# Patient Record
Sex: Male | Born: 1995 | Race: White | Hispanic: No | Marital: Single | Smoking: Never smoker
Health system: Southern US, Community
[De-identification: ages and names within clinical notes are randomized; demographics above are authoritative.]

## PROBLEM LIST (undated history)

## (undated) DIAGNOSIS — R569 Unspecified convulsions: Secondary | ICD-10-CM

---

## 2019-01-19 ENCOUNTER — Other Ambulatory Visit: Payer: Self-pay

## 2019-01-19 ENCOUNTER — Emergency Department: Payer: Commercial Managed Care - PPO

## 2019-01-19 ENCOUNTER — Emergency Department
Admission: EM | Admit: 2019-01-19 | Discharge: 2019-01-19 | Disposition: A | Discharge status: Home / Self Care | Payer: Commercial Managed Care - PPO | Attending: Emergency Medicine | Admitting: Emergency Medicine

## 2019-01-19 ENCOUNTER — Encounter: Payer: Self-pay | Admitting: Emergency Medicine

## 2019-01-19 DIAGNOSIS — W268XXA Contact with other sharp object(s), not elsewhere classified, initial encounter: Secondary | ICD-10-CM | POA: Diagnosis not present

## 2019-01-19 DIAGNOSIS — S61411A Laceration without foreign body of right hand, initial encounter: Secondary | ICD-10-CM | POA: Diagnosis not present

## 2019-01-19 DIAGNOSIS — Y9389 Activity, other specified: Secondary | ICD-10-CM | POA: Diagnosis not present

## 2019-01-19 DIAGNOSIS — Y929 Unspecified place or not applicable: Secondary | ICD-10-CM | POA: Diagnosis not present

## 2019-01-19 DIAGNOSIS — Y998 Other external cause status: Secondary | ICD-10-CM | POA: Diagnosis not present

## 2019-01-19 DIAGNOSIS — S6991XA Unspecified injury of right wrist, hand and finger(s), initial encounter: Secondary | ICD-10-CM | POA: Diagnosis present

## 2019-01-19 HISTORY — DX: Unspecified convulsions: R56.9

## 2019-01-19 MED ORDER — LIDOCAINE HCL (PF) 1 % IJ SOLN
5.0000 mL | Freq: Once | INTRAMUSCULAR | Status: AC
  Administered 2019-01-19: 5 mL via INTRADERMAL
  Filled 2019-01-19: qty 5

## 2019-01-19 MED ORDER — CEPHALEXIN 500 MG PO CAPS: 500.0000 mg | ORAL_CAPSULE | Freq: Three times a day (TID) | ORAL | 0 refills | Status: DC

## 2019-01-19 NOTE — Discharge Instructions (Addendum)
Follow up with your regular doctor, the ER, or you may remove the sutures yourself.  REturn if any sign of infection

## 2019-01-19 NOTE — ED Provider Notes (Signed)
[facility] Emergency Department Provider Note  ____________________________________________   First MD Initiated Contact with Patient 01/19/19 1324     (approximate)  I have reviewed the triage vital signs and the nursing notes.   HISTORY  Chief Complaint Laceration    HPI Kyle Madden is a 23 y.o. male presents emergency department complaining of a laceration to his right hand.  He was welding his truck and a piece of metal came down to hit his hand.  Tdap is up-to-date.    Past Medical History:  Diagnosis Date  . Seizures (HCC)     There are no active problems to display for this patient.   History reviewed. No pertinent surgical history.  Prior to Admission medications   Medication Sig Start Date End Date Taking? Authorizing Provider  cephALEXin (KEFLEX) 500 MG capsule Take 1 capsule (500 mg total) by mouth 3 (three) times daily. 01/19/19   Faythe GheeFisher, Susan W, PA-C    Allergies Ritalin [methylphenidate hcl]  No family history on file.  Social History Social History   Tobacco Use  . Smoking status: Never Smoker  . Smokeless tobacco: Never Used  Substance Use Topics  . Alcohol use: Yes    Comment: occasional  . Drug use: Not on file    Review of Systems  Constitutional: No fever/chills Eyes: No visual changes. ENT: No sore throat. Respiratory: Denies cough Genitourinary: Negative for dysuria. Musculoskeletal: Negative for back pain.  Right hand pain with laceration Skin: Negative for rash.    ____________________________________________   PHYSICAL EXAM:  VITAL SIGNS: ED Triage Vitals  Enc Vitals Group     BP 01/19/19 1324 120/78     Pulse Rate 01/19/19 1324 88     Resp 01/19/19 1324 18     Temp 01/19/19 1324 98 F (36.7 C)     Temp Source 01/19/19 1324 Oral     SpO2 01/19/19 1324 99 %     Weight 01/19/19 1320 135 lb (61.2 kg)     Height 01/19/19 1320 5\' 3"  (1.6 m)     Head Circumference --      Peak Flow --     Pain Score 01/19/19 1320 4     Pain Loc --      Pain Edu? --      Excl. in GC? --     Constitutional: Alert and oriented. Well appearing and in no acute distress. Eyes: Conjunctivae are normal.  Head: Atraumatic. Nose: No congestion/rhinnorhea. Mouth/Throat: Mucous membranes are moist.   Neck:  supple no lymphadenopathy noted Cardiovascular: Normal rate, regular rhythm. Respiratory: Normal respiratory effort.  No retractions,  GU: deferred Musculoskeletal: FROM all extremities, warm and well perfused, 2 cm laceration noted to the right hand, no foreign bodies noted, area is tender to palpation Neurologic:  Normal speech and language.  Skin:  Skin is warm, dry , positive laceration.. No rash noted. Psychiatric: Mood and affect are normal. Speech and behavior are normal.  ____________________________________________   LABS (all labs ordered are listed, but only abnormal results are displayed)  Labs Reviewed - No data to display ____________________________________________   ____________________________________________  RADIOLOGY  X-ray of the right hand negative for foreign body or fracture  ____________________________________________   PROCEDURES  Procedure(s) performed:   Marland Kitchen.Marland Kitchen.Laceration Repair Date/Time: 01/19/2019 4:01 PM Performed by: Faythe GheeFisher, Susan W, PA-C Authorized by: Faythe GheeFisher, Susan W, PA-C   Consent:    Consent obtained:  Verbal   Consent given by:  Patient   Risks discussed:  Infection, pain, retained foreign body, tendon damage, poor wound healing and nerve damage Anesthesia (see MAR for exact dosages):    Anesthesia method:  Local infiltration   Local anesthetic:  Lidocaine 1% w/o epi Laceration details:    Location:  Hand   Hand location:  R palm   Length (cm):  2   Depth (mm):  2 Repair type:    Repair type:  Simple Pre-procedure details:    Preparation:  Patient was prepped and draped in usual sterile fashion Exploration:    Hemostasis achieved  with:  Direct pressure   Wound exploration: wound explored through full range of motion     Wound extent: foreign bodies/material     Wound extent: no muscle damage noted, no nerve damage noted, no tendon damage noted and no underlying fracture noted     Foreign bodies/material:  Dirt fragments Treatment:    Area cleansed with:  Saline   Amount of cleaning:  Standard   Irrigation solution:  Sterile saline   Irrigation method:  Syringe and tap   Visualized foreign bodies/material removed: yes   Skin repair:    Repair method:  Sutures   Suture size:  5-0   Suture material:  Nylon   Suture technique:  Simple interrupted   Number of sutures:  7 Approximation:    Approximation:  Close Post-procedure details:    Dressing:  Non-adherent dressing   Patient tolerance of procedure:  Tolerated well, no immediate complications      ____________________________________________   INITIAL IMPRESSION / ASSESSMENT AND PLAN / ED COURSE  Pertinent labs & imaging results that were available during my care of the patient were reviewed by me and considered in my medical decision making (see chart for details).   Patient is 23 year old male presents emergency department with laceration to the right hand.  Physical exam shows a laceration on the palmar side of the right hand.  No foreign body or tendon involvement is noted.  X-ray of the right hand is negative for foreign body or fracture.  See procedure note for repair.  During the cleansing of the wound dirt was removed.  Tolerated procedure well.  Was placed on Keflex due to the contamination with the dirt.  He is to follow-up with his regular doctor if not better in 1 week.  Sutures to be removed in 1 week.  Return emergency department worsening.  Is discharged stable condition.     As part of my medical decision making, I reviewed the following data within the [facility] notes reviewed and incorporated, Old chart  reviewed, Radiograph reviewed x-ray of the right hand is negative, Notes from prior ED visits and [place] Controlled Substance Database  ____________________________________________   FINAL CLINICAL IMPRESSION(S) / ED DIAGNOSES  Final diagnoses:  Laceration of right hand without foreign body, initial encounter      NEW MEDICATIONS STARTED DURING THIS VISIT:  Discharge Medication List as of 01/19/2019  2:34 PM    START taking these medications   Details  cephALEXin (KEFLEX) 500 MG capsule Take 1 capsule (500 mg total) by mouth 3 (three) times daily., Starting Mon 01/19/2019, Normal         Note:  This document was prepared using Dragon voice recognition software and may include unintentional dictation errors.    Versie Starks, PA-C 01/19/19 1604    Duffy Bruce, MD 01/19/19 2256

## 2019-01-19 NOTE — ED Triage Notes (Signed)
See triage note  States he was welding on his truck  A piece of metal came down and hit his right hand

## 2019-02-26 ENCOUNTER — Other Ambulatory Visit: Payer: Self-pay

## 2019-02-26 DIAGNOSIS — Z20822 Contact with and (suspected) exposure to covid-19: Secondary | ICD-10-CM

## 2019-03-02 LAB — NOVEL CORONAVIRUS, NAA: SARS-CoV-2, NAA: NOT DETECTED

## 2020-06-11 IMAGING — DX RIGHT HAND - COMPLETE 3+ VIEW
3 series · 3 of 3 positions shown · non-contrast
Comparison: None.

CLINICAL DATA: Right hand laceration.  Welding injury.

EXAM:
RIGHT HAND - COMPLETE 3+ VIEW

[hand ap]
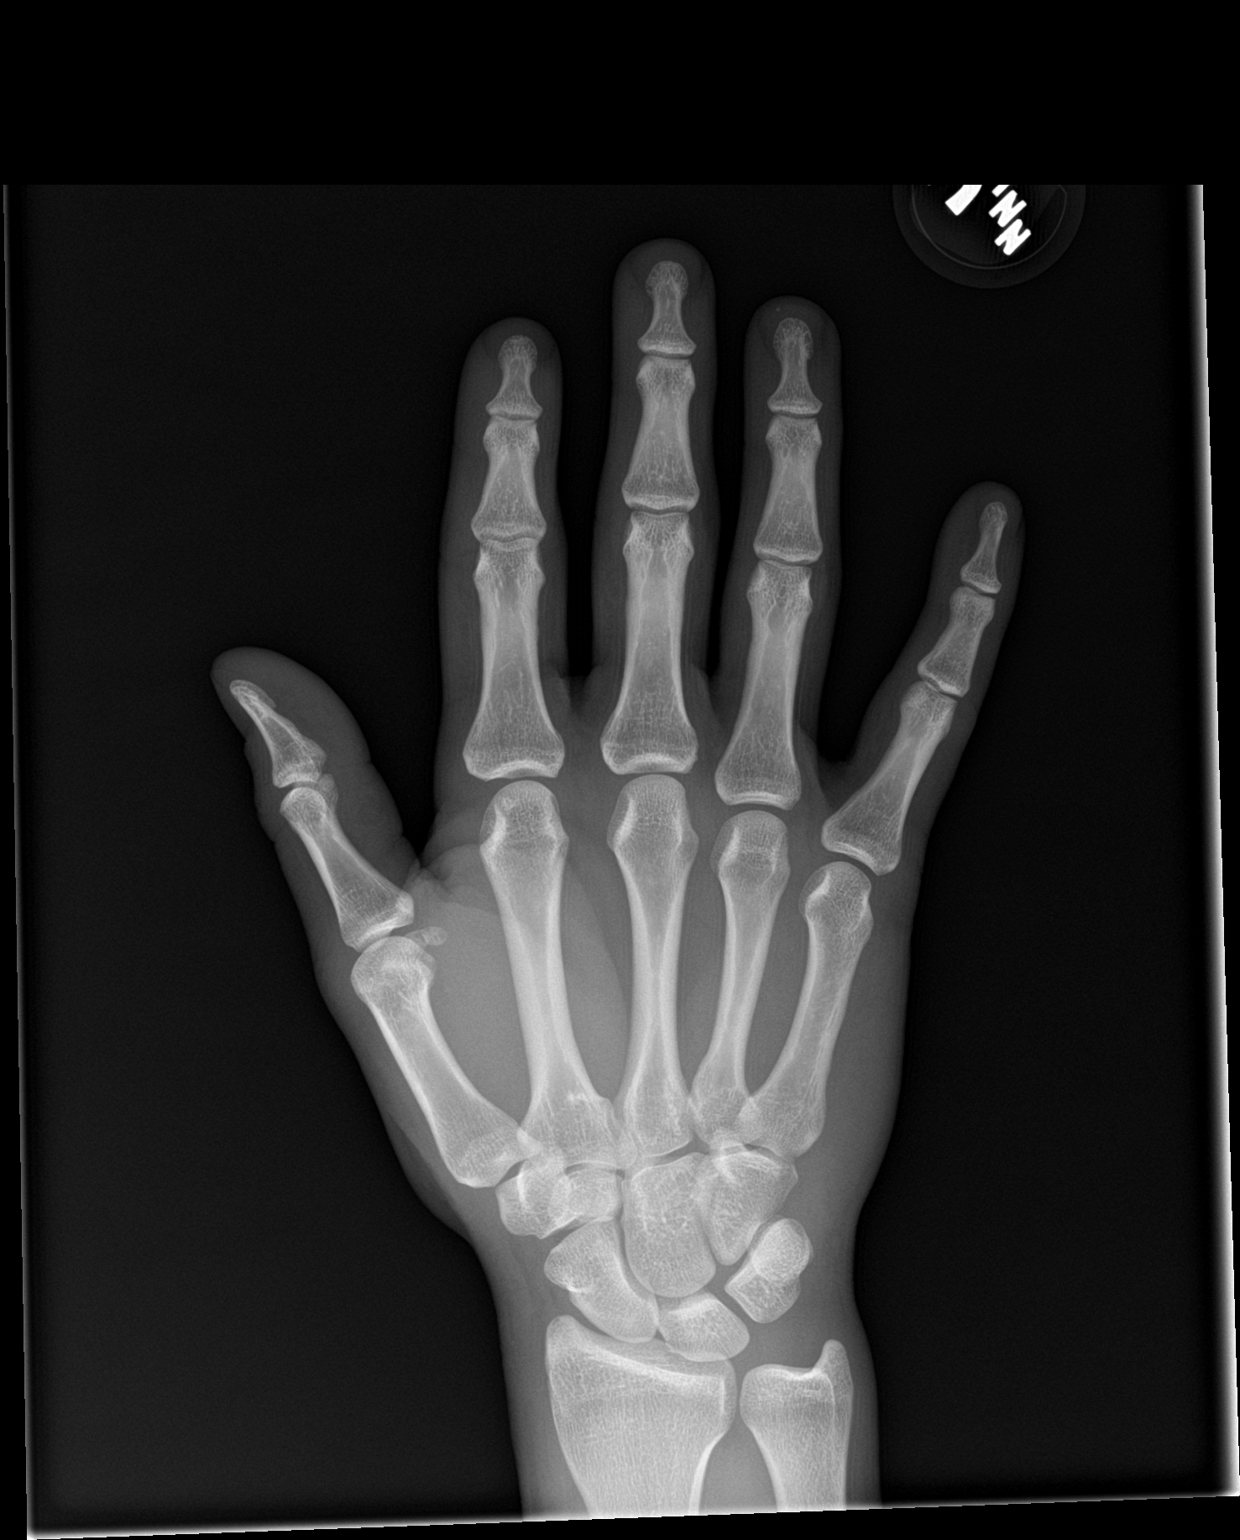

[hand obl]
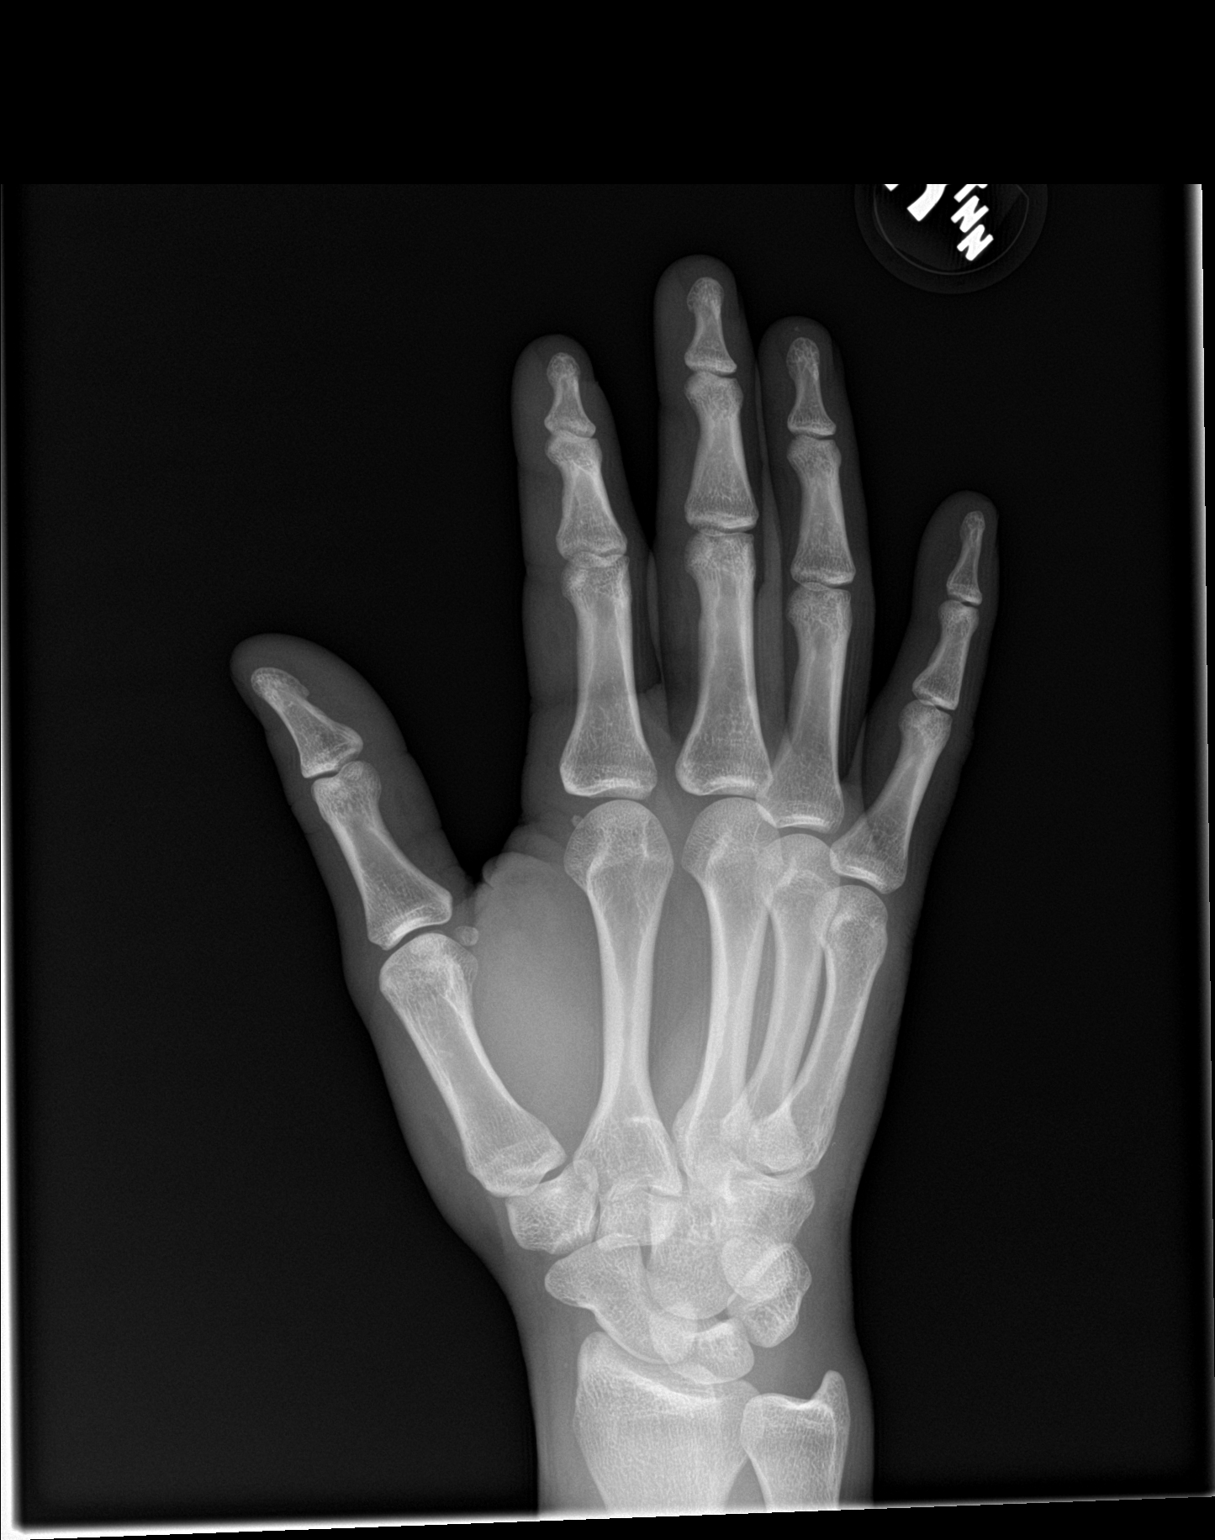

[hand lat]
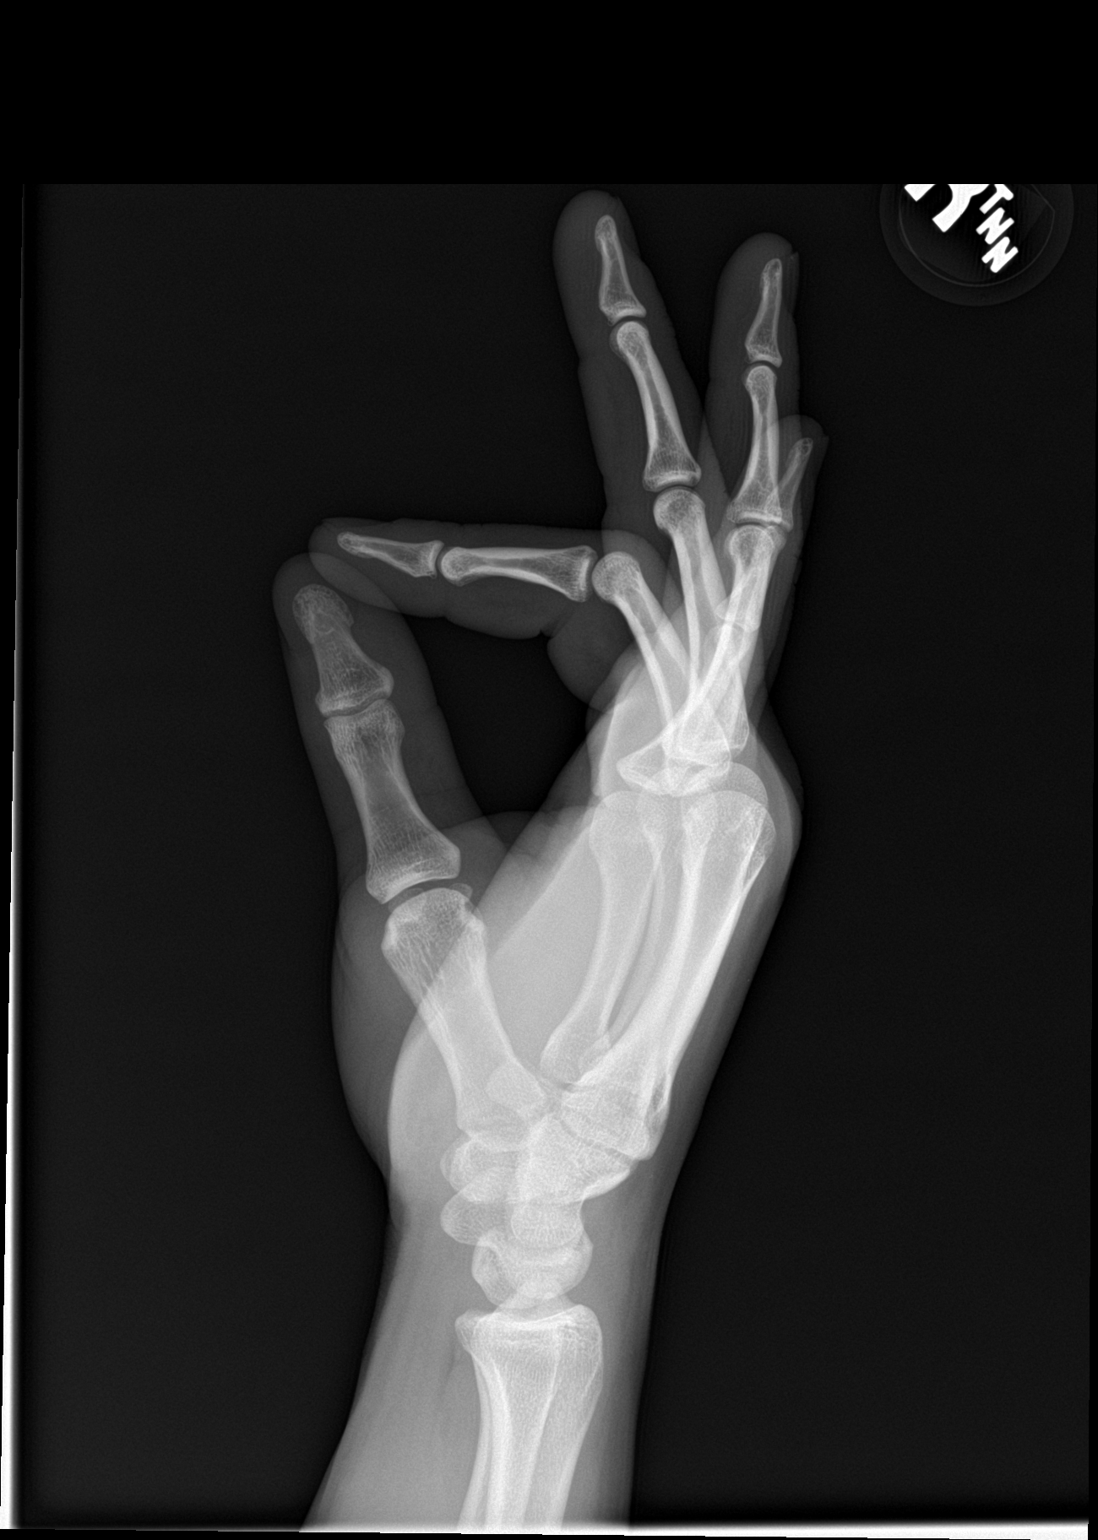

[3 of 3 positions shown; findings below may reference images not displayed]

FINDINGS: The joint spaces are maintained. No acute fracture is identified. No
radiopaque foreign body.
IMPRESSION: No fracture or radiopaque foreign body.

## 2021-08-22 NOTE — Progress Notes (Incomplete)
08/23/21 3:53 PM   Kyle Madden 07-Aug-1996 [id number]  Referring provider: No referring provider defined for this encounter.  No chief complaint on file.   HPI: Kyle Madden is a 26 y.o. male who presents today for further evaluation of possible vasectomy.  He denies a history of testicular trauma or pain.  No urinary issues.  No previous scrotal surgeries.   PMH: Past Medical History:  Diagnosis Date   Seizures [facility])     Surgical History: No past surgical history on file.  Home Medications:  Allergies as of 08/23/2021       Reactions   Ritalin [methylphenidate Hcl]    Rash  other        Medication List        Accurate as of August 23, 2021  3:53 PM. If you have any questions, ask your nurse or doctor.          cephALEXin 500 MG capsule Commonly known as: KEFLEX Take 1 capsule (500 mg total) by mouth 3 (three) times daily.        Allergies:  Allergies  Allergen Reactions   Ritalin [Methylphenidate Hcl]     Rash  other    Family History: No family history on file.  Social History:  reports that he has never smoked. He has never used smokeless tobacco. He reports current alcohol use. No history on file for drug use.   Physical Exam: There were no vitals taken for this visit.  Constitutional:  Alert and oriented, No acute distress. HEENT: [place] AT, moist mucus membranes.  Trachea midline, no masses. Cardiovascular: No clubbing, cyanosis, or edema. Respiratory: Normal respiratory effort, no increased work of breathing. GI: Abdomen is soft, nontender, nondistended, no abdominal masses GU: Normal phallus.  Bilateral descended testicles without masses.  Vasa easily palpable bilaterally. Skin: No rashes, bruises or suspicious lesions. Neurologic: Grossly intact, no focal deficits, moving all 4 extremities. Psychiatric: Normal mood and affect.   Assessment & Plan:    1. Vasectomy evaluation Today, we discussed what the vas deferens is, where it  is located, and its function. We reviewed the procedure for vasectomy, it's risks, benefits, alternatives, and likelihood of achieving his goals. We discussed in detail the procedure, complications, and recovery as well as the need for clearance prior to unprotected intercourse. We discussed that vasectomy does not protect against sexually transmitted diseases. We discussed that this procedure does not result in immediate sterility and that they would need to use other forms of birth control until he has been cleared with negative postvasectomy semen analyses. I explained that the procedure is considered to be permanent and that attempts at reversal have varying degrees of success. These options include vasectomy reversal, sperm retrieval, and in vitro fertilization; these can be very expensive. We discussed the chance of postvasectomy pain syndrome which occurs in less than 5% of patients. I explained to the patient that there is no treatment to resolve this chronic pain, and that if it developed I would not be able to help resolve the issue, but that surgery is generally not needed for correction. I explained there have even been reports of systemic like illness associated with this chronic pain, and that there was no good cure. I explained that vasectomy it is not a 100% reliable form of birth control, and the risk of pregnancy after vasectomy is approximately 1 in 2000 men who had a negative postvasectomy semen analysis or rare non-motile sperm. I explained that repeat vasectomy was necessary  in less than 1% of vasectomy procedures when employing the type of technique that I use. I explained that he should refrain from ejaculation for approximately one week following vasectomy. I explained that there are other options for birth control which are permanent and non-permanent; we discussed these. I explained the rates of surgical complications, such as symptomatic hematoma or infection, are low (1-2%) and vary with  the surgeon's experience and criteria used to diagnose the complication.   The patient had the opportunity to ask questions to his stated satisfaction. He voiced understanding of the above factors and stated that he has read all the information provided to him and the packets and informed consent.  He is interested in receiving of Valium 10 mg prior to the procedure for the purpose of anxiolysis.  A prescription was given today.  He will have a driver on the day of the procedure.   No follow-ups on file.  I,Kailey Littlejohn,acting as a Education administrator for Hollice Espy, MD.,have documented all relevant documentation on the behalf of Hollice Espy, MD,as directed by  Hollice Espy, MD while in the presence of Hollice Espy, [place] [address] [place], [place] [address] [phone]

## 2021-08-23 ENCOUNTER — Other Ambulatory Visit: Payer: Self-pay

## 2021-08-23 ENCOUNTER — Ambulatory Visit: Payer: Self-pay | Admitting: Urology

## 2022-10-26 ENCOUNTER — Ambulatory Visit (INDEPENDENT_AMBULATORY_CARE_PROVIDER_SITE_OTHER): Payer: BC Managed Care – PPO | Admitting: Urology

## 2022-10-26 ENCOUNTER — Encounter: Payer: Self-pay | Admitting: Urology

## 2022-10-26 VITALS — BP 149/93 | HR 87 | Ht 66.0 in | Wt 150.0 lb

## 2022-10-26 DIAGNOSIS — Z3009 Encounter for other general counseling and advice on contraception: Secondary | ICD-10-CM

## 2022-10-26 NOTE — Progress Notes (Signed)
Assessment: 1. Encounter for vasectomy assessment     Plan: Schedule for vasectomy per patient request He declined the prescription for alprazolam.  Chief Complaint:  Chief Complaint  Patient presents with   VAS Consult    History of Present Illness:  Kyle Madden is a 27 y.o. male who is seen for evaluation of vasectomy.  He is married with 2 children.  No history of scrotal trauma or infection.   Past Medical History:  Past Medical History:  Diagnosis Date   Seizures ([place])     Past Surgical History:  No past surgical history on file.  Allergies:  Allergies  Allergen Reactions   Ritalin [Methylphenidate Hcl]     Rash  other    Family History:  No family history on file.  Social History:  Social History   Tobacco Use   Smoking status: Never   Smokeless tobacco: Never  Substance Use Topics   Alcohol use: Yes    Comment: occasional    Review of symptoms:  Constitutional:  Negative for unexplained weight loss, night sweats, fever, chills ENT:  Negative for nose bleeds, sinus pain, painful swallowing CV:  Negative for chest pain, shortness of breath, exercise intolerance, palpitations, loss of consciousness Resp:  Negative for cough, wheezing, shortness of breath GI:  Negative for nausea, vomiting, diarrhea, bloody stools GU:  Positives noted in HPI; otherwise negative for gross hematuria, dysuria, urinary incontinence Neuro:  Negative for seizures, poor balance, limb weakness, slurred speech Psych:  Negative for lack of energy, depression, anxiety Endocrine:  Negative for polydipsia, polyuria, symptoms of hypoglycemia (dizziness, hunger, sweating) Hematologic:  Negative for anemia, purpura, petechia, prolonged or excessive bleeding, use of anticoagulants  Allergic:  Negative for difficulty breathing or choking as a result of exposure to anything; no shellfish allergy; no allergic response (rash/itch) to materials, foods  Physical exam: BP (!) 149/93    Pulse 87   Ht 5\' 6"  (1.676 m)   Wt 150 lb (68 kg)   BMI 24.21 kg/m  GENERAL APPEARANCE:  Well appearing, well developed, well nourished, NAD HEENT:  Atraumatic, normocephalic, oropharynx clear NECK:  Supple without lymphadenopathy or thyromegaly ABDOMEN:  Soft, non-tender, no masses EXTREMITIES:  Moves all extremities well, without clubbing, cyanosis, or edema NEUROLOGIC:  Alert and oriented x 3, normal gait, CN II-XII grossly intact MENTAL STATUS:  appropriate BACK:  Non-tender to palpation, No CVAT SKIN:  Warm, dry, and intact GU: Penis:  uncircumcised Meatus: Normal Scrotum: Vasa palpated bilaterally Testis: normal without masses bilateral Epididymis: normal  Results: None  VASECTOMY CONSULTATION  [name] presents for vasectomy consultation today.  He is a 27 y.o. male, married with 2  children .  He and his wife have discussed the issues regarding long-term fertility and are comfortable with this decision.  He presents for consideration for vasectomy.  I discussed the issues in detail with him today and he expressed no reservations.  As to the procedure, no scalpel technique vasectomy is explained and reviewed in detail.  Generalized risks including but not limited to bleeding, infection, orchalgia, testicular atrophy, epididymitis, scrotal hematoma, and chronic pain are discussed.   Additionally, he understands that the possibility of vas recanalization following vasectomy is possible although rare.  Most importantly, the patient understands that he is not sterile initially and will need a semen analysis check to confirm sterility such that no sperm are seen.  He is advised to avoid ejaculation for 10 days following the procedure.  The initial semen analysis  will be checked in approximately 12 weeks and in some patients, several months may be required for clearance of all sperm.  He reports a clear understanding of the need for continued birth control until sterility is  confirmed.  Otherwise, general issues regarding local anesthesia, prep, alprazolam are discussed and he reports a clear understanding.

## 2022-10-26 NOTE — Patient Instructions (Signed)

## 2022-11-14 ENCOUNTER — Ambulatory Visit (INDEPENDENT_AMBULATORY_CARE_PROVIDER_SITE_OTHER): Payer: BC Managed Care – PPO | Admitting: Urology

## 2022-11-14 ENCOUNTER — Encounter: Payer: Self-pay | Admitting: Urology

## 2022-11-14 VITALS — BP 163/116 | HR 137 | Ht 66.0 in | Wt 148.0 lb

## 2022-11-14 DIAGNOSIS — Z302 Encounter for sterilization: Secondary | ICD-10-CM

## 2022-11-14 MED ORDER — CEPHALEXIN 500 MG PO CAPS: 500.0000 mg | ORAL_CAPSULE | Freq: Three times a day (TID) | ORAL | 0 refills | Status: AC

## 2022-11-14 MED ORDER — HYDROCODONE-ACETAMINOPHEN 5-325 MG PO TABS: 1.0000 | ORAL_TABLET | Freq: Four times a day (QID) | ORAL | 0 refills | Status: AC | PRN

## 2022-11-14 NOTE — Progress Notes (Signed)
   Assessment: 1. Encounter for vasectomy     Plan: Post vasectomy instructions given Post vas semen analysis in 12 weeks Rx provided  Chief Complaint:  Chief Complaint  Patient presents with   VAS    History of Present Illness:  Kyle Madden is a 27 y.o. male who is seen for vasectomy.  He is married with 2 children.  No history of scrotal trauma or infection.   Past Medical History:  Past Medical History:  Diagnosis Date   Seizures ([place])     Past Surgical History:  No past surgical history on file.  Allergies:  Allergies  Allergen Reactions   Ritalin [Methylphenidate Hcl]     Rash  other    Family History:  No family history on file.  Social History:  Social History   Tobacco Use   Smoking status: Never   Smokeless tobacco: Never  Substance Use Topics   Alcohol use: Yes    Comment: occasional    ROS: Constitutional:  Negative for fever, chills, weight loss CV: Negative for chest pain, previous MI, hypertension Respiratory:  Negative for shortness of breath, wheezing, sleep apnea, frequent cough GI:  Negative for nausea, vomiting, bloody stool, GERD  Physical exam: BP (!) 163/116   Pulse (!) 137   Ht 5\' 6"  (1.676 m)   Wt 148 lb (67.1 kg)   BMI 23.89 kg/m  GENERAL APPEARANCE:  Well appearing, well developed, well nourished, NAD HEENT:  Atraumatic, normocephalic, oropharynx clear NECK:  Supple without lymphadenopathy or thyromegaly ABDOMEN:  Soft, non-tender, no masses EXTREMITIES:  Moves all extremities well, without clubbing, cyanosis, or edema NEUROLOGIC:  Alert and oriented x 3, normal gait, CN II-XII grossly intact MENTAL STATUS:  appropriate BACK:  Non-tender to palpation, No CVAT SKIN:  Warm, dry, and intact  Results: None  VASECTOMY PROCEDURE:  Kyle Madden presents for vasectomy following previous vasectomy consultation and permit is signed.  The patient's anterior scrotal wall is shaved and prepped with Betadine in standard  sterile fashion.  1% lidocaine is used as local anesthetic in the scrotal and peri vasal tissue.  A standard median raphe punch incision is made and a no scalpel technique vasectomy is performed.  Bilateral vas are isolated from the peri vasal tissue and an approximately 1 cm segment of vas is excised.  Proximal and distal segments are internally cauterized with electric heat cautery. Interposition of perivasal tissue was performed.  Bilateral palpation confirms bilateral vasectomy defect and no significant bleeding or hematoma is identified.  Neosporin gauze dressing and a scrotal support are applied.    Disposition: Patient is discharged home with Rx for pain medication and antibiotics.  Patient is given routine vasectomy instructions.   Most importantly, he is instructed and cautioned again regarding the need for protected intercourse until such time that a single  negative semen analysis has been obtained.  The initial semen analysis will be checked in approximately 12  weeks.  The patient reports a clear understanding.  He will call with any interval questions or concerns.

## 2022-11-14 NOTE — Patient Instructions (Signed)

## 2022-11-28 DIAGNOSIS — Z0289 Encounter for other administrative examinations: Secondary | ICD-10-CM

## 2023-02-12 ENCOUNTER — Other Ambulatory Visit: Payer: Self-pay

## 2023-02-12 DIAGNOSIS — Z302 Encounter for sterilization: Secondary | ICD-10-CM

## 2023-02-18 ENCOUNTER — Other Ambulatory Visit: Payer: BC Managed Care – PPO

## 2023-02-18 DIAGNOSIS — Z302 Encounter for sterilization: Secondary | ICD-10-CM

## 2023-02-19 LAB — POST-VAS SPERM EVALUATION,QUAL: Volume: 1.1 mL

## 2023-05-07 DIAGNOSIS — R030 Elevated blood-pressure reading, without diagnosis of hypertension: Secondary | ICD-10-CM | POA: Diagnosis not present

## 2023-05-07 DIAGNOSIS — I498 Other specified cardiac arrhythmias: Secondary | ICD-10-CM | POA: Diagnosis not present

## 2023-05-07 DIAGNOSIS — R202 Paresthesia of skin: Secondary | ICD-10-CM | POA: Diagnosis not present
